# Patient Record
Sex: Male | Born: 1995 | Race: White | Hispanic: No | Marital: Single | Smoking: Current every day smoker
Health system: Southern US, Community
[De-identification: ages and names within clinical notes are randomized; demographics above are authoritative.]

## PROBLEM LIST (undated history)

## (undated) HISTORY — PX: HERNIA REPAIR: SHX51

---
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
## 2016-02-12 ENCOUNTER — Ambulatory Visit (INDEPENDENT_AMBULATORY_CARE_PROVIDER_SITE_OTHER): Payer: BLUE CROSS/BLUE SHIELD | Admitting: Family Medicine

## 2016-02-12 ENCOUNTER — Encounter: Payer: Self-pay | Admitting: Family Medicine

## 2016-02-12 VITALS — BP 116/74 | Temp 98.6°F | Ht 76.0 in | Wt 241.0 lb

## 2016-02-12 DIAGNOSIS — B360 Pityriasis versicolor: Secondary | ICD-10-CM | POA: Diagnosis not present

## 2016-02-12 MED ORDER — KETOCONAZOLE 2 % EX CREA: 1.0000 "application " | TOPICAL_CREAM | Freq: Two times a day (BID) | CUTANEOUS | Status: DC

## 2016-02-12 MED ORDER — FLUCONAZOLE 150 MG PO TABS
ORAL_TABLET | ORAL | Status: DC
Start: 2016-02-12 — End: 2016-09-02

## 2016-02-12 NOTE — Progress Notes (Signed)
   Subjective:    Patient ID: Richard Shah, male    DOB: July 24, 1996, 20 y.o.   MRN: [id number]  Rash This is a new problem. Episode onset: one month ago. Location: neck and back. The rash is characterized by itchiness. He was exposed to nothing. Past treatments include nothing.    No drainage some itching with it no fever chills  Review of Systems  Skin: Positive for rash.       Objective:   Physical Exam  What appears to be tinea versicolor on the upper back and around the neck area      Assessment & Plan:  Richard Shah a versicolor will treat this with Diflucan by mouth 300 mg once a week for 2 weeks plus also topical treatment twice daily

## 2016-05-07 ENCOUNTER — Ambulatory Visit: Payer: BLUE CROSS/BLUE SHIELD

## 2016-09-02 ENCOUNTER — Ambulatory Visit (INDEPENDENT_AMBULATORY_CARE_PROVIDER_SITE_OTHER): Payer: BLUE CROSS/BLUE SHIELD | Admitting: Family Medicine

## 2016-09-02 ENCOUNTER — Encounter: Payer: Self-pay | Admitting: Family Medicine

## 2016-09-02 VITALS — BP 120/80 | Ht 76.0 in | Wt 239.1 lb

## 2016-09-02 DIAGNOSIS — F439 Reaction to severe stress, unspecified: Secondary | ICD-10-CM

## 2016-09-02 DIAGNOSIS — N529 Male erectile dysfunction, unspecified: Secondary | ICD-10-CM | POA: Diagnosis not present

## 2016-09-02 NOTE — Progress Notes (Signed)
   Subjective:    Patient ID: Richard Shah, male    DOB: 1996-10-18, 20 y.o.   MRN: [id number]  HPI Patient is here today to discuss issues he is having with sexual encounters. Onset 2 weeks ago. Treatments tried: none.  Patient relates that he is able to get an erection in the morning when he wakes up. He is able to get an erection with masturbation. He has had 4 different episodes with girlfriend where he had a hard time getting an erection and is caused him some psychologic stresses well. Patient has no other concerns at this time.  Patient under a lot of stress denies being depressed. States he finds himself being anxious at times. At times feels low bit down about how things are. Patient denies being suicidal homicidal.  Review of Systems     Objective:   Physical Exam Lungs clear heart regular       Assessment & Plan:  Fatigue-encourage patient get decent amount arrest Erectile dysfunction I believe this is stress oriented. Check testosterone level and thyroid level We talked about some sexual health ideas that would help with erectile function. I do not believe the patient is depressed but I do believe he is significantly stressed and would benefit from seeing a counselor. I recommend referral to Dr. Claudine Moutonodenberg for further counseling. I believe that the patient can do a better job with handling his stress the sexual health function problem will resolve itself.

## 2016-09-03 LAB — TESTOSTERONE: Testosterone: 488 ng/dL (ref 264–916)

## 2016-09-03 LAB — TSH: TSH: 0.871 u[IU]/mL (ref 0.450–4.500)

## 2016-09-04 ENCOUNTER — Encounter: Payer: Self-pay | Admitting: Family Medicine

## 2016-09-19 ENCOUNTER — Encounter: Payer: Self-pay | Admitting: Family Medicine

## 2017-01-09 ENCOUNTER — Telehealth (HOSPITAL_COMMUNITY): Payer: Self-pay | Admitting: *Deleted

## 2017-01-09 NOTE — Telephone Encounter (Signed)
patient returned phone call.   His mother had called earlier asking questions regarding voice message left on phone regarding an appointment.  It was explained to patient that due to confidentiality  we could not provide answers to questions his mom asked regarding phone call.  She said we left message on her phone and she needed to know what the call was about.   Explained to patient that the phone number is in the system, provided by the referring doctor office, and that was the number we used to call him.  Patient said he do not wish to schedule an appointment.

## 2017-01-09 NOTE — Telephone Encounter (Signed)
left voice message regarding appointment. 

## 2017-06-13 ENCOUNTER — Encounter (HOSPITAL_BASED_OUTPATIENT_CLINIC_OR_DEPARTMENT_OTHER): Payer: Self-pay | Admitting: *Deleted

## 2017-06-13 ENCOUNTER — Emergency Department (HOSPITAL_BASED_OUTPATIENT_CLINIC_OR_DEPARTMENT_OTHER)
Admission: EM | Admit: 2017-06-13 | Discharge: 2017-06-13 | Disposition: A | Discharge status: Home / Self Care | Payer: Worker's Compensation | Attending: Emergency Medicine | Admitting: Emergency Medicine

## 2017-06-13 DIAGNOSIS — S39012A Strain of muscle, fascia and tendon of lower back, initial encounter: Secondary | ICD-10-CM | POA: Diagnosis not present

## 2017-06-13 DIAGNOSIS — F17200 Nicotine dependence, unspecified, uncomplicated: Secondary | ICD-10-CM | POA: Insufficient documentation

## 2017-06-13 DIAGNOSIS — Y939 Activity, unspecified: Secondary | ICD-10-CM | POA: Insufficient documentation

## 2017-06-13 DIAGNOSIS — Y929 Unspecified place or not applicable: Secondary | ICD-10-CM | POA: Insufficient documentation

## 2017-06-13 DIAGNOSIS — X500XXA Overexertion from strenuous movement or load, initial encounter: Secondary | ICD-10-CM | POA: Insufficient documentation

## 2017-06-13 DIAGNOSIS — S3992XA Unspecified injury of lower back, initial encounter: Secondary | ICD-10-CM | POA: Diagnosis present

## 2017-06-13 DIAGNOSIS — Y990 Civilian activity done for income or pay: Secondary | ICD-10-CM | POA: Diagnosis not present

## 2017-06-13 MED ORDER — IBUPROFEN 600 MG PO TABS: 600.0000 mg | ORAL_TABLET | Freq: Three times a day (TID) | ORAL | 0 refills | Status: DC | PRN

## 2017-06-13 NOTE — ED Triage Notes (Addendum)
Back injury at work 3 days ago. He has already had a UDS. C.o pain.

## 2017-06-14 NOTE — ED Provider Notes (Signed)
[facility]   CSN: [id number] Arrival date & time: 06/13/17  2232     History   Chief Complaint Chief Complaint  Patient presents with  . Back Pain    HPI Richard Shah is a 21 y.o. male.  HPI Patient presents the emergency department after injuring his back 3 days ago work.  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  He denies weakness in his arms and legs.  Denies prior history of back issues.  No fevers or chills.  Denies abdominal pain.  No chest pain shortness of breath.  No other complaints.  Symptoms are mild in severity.  He presents requesting paperwork for work to be filled out   History reviewed. No pertinent past medical history.  There are no active problems to display for this patient.   Past Surgical History:  Procedure Laterality Date  . HERNIA REPAIR         Home Medications    Prior to Admission medications   Medication Sig Start Date End Date Taking? Authorizing Provider  ibuprofen (ADVIL,MOTRIN) 600 MG tablet Take 1 tablet (600 mg total) by mouth every 8 (eight) hours as needed. 06/13/17   Azalia Bilis, MD    Family History No family history on file.  Social History Social History  Substance Use Topics  . Smoking status: Current Every Day Smoker  . Smokeless tobacco: Never Used  . Alcohol use No     Allergies   Patient has no known allergies.   Review of Systems Review of Systems  All other systems reviewed and are negative.    Physical Exam Updated Vital Signs BP (!) 145/94   Pulse 68   Temp 98.2 F (36.8 C) (Oral)   Resp 18   Ht 6\' 4"  (1.93 m)   Wt 108.9 kg (240 lb)   SpO2 100%   BMI 29.21 kg/m   Physical Exam  Constitutional: He is oriented to person, place, and time. He appears well-developed and well-nourished.  HENT:  Head: Normocephalic.  Eyes: EOM are normal.  Neck: Normal range of  motion.  Pulmonary/Chest: Effort normal.  Abdominal: He exhibits no distension.  Musculoskeletal: Normal range of motion.  No thoracic or lumbar point tenderness.  Mild paralumbar tenderness on the right without significant spasm.  Normal ambulation.  Normal strength in his lower extremities  Neurological: He is alert and oriented to person, place, and time.  Psychiatric: He has a normal mood and affect.  Nursing note and vitals reviewed.    ED Treatments / Results  Labs (all labs ordered are listed, but only abnormal results are displayed) Labs Reviewed - No data to display  EKG  EKG Interpretation None       Radiology No results found.  Procedures Procedures (including critical care time)  Medications Ordered in ED Medications - No data to display   Initial Impression / Assessment and Plan / ED Course  I have reviewed the triage vital signs and the nursing notes.  Pertinent labs & imaging results that were available during my care of the patient were reviewed by me and considered in my medical decision making (see chart for details).     NSAIDs recommended.  Online physical therapy modules for low back pain recommended  Final Clinical Impressions(s) / ED Diagnoses   Final diagnoses:  Lumbar strain, initial encounter    New Prescriptions Discharge  Medication List as of 06/13/2017 11:39 PM    START taking these medications   Details  ibuprofen (ADVIL,MOTRIN) 600 MG tablet Take 1 tablet (600 mg total) by mouth every 8 (eight) hours as needed., Starting Fri 06/13/2017, Print         Azalia Bilis, MD 06/14/17 0130

## 2017-09-17 ENCOUNTER — Ambulatory Visit (INDEPENDENT_AMBULATORY_CARE_PROVIDER_SITE_OTHER): Payer: BLUE CROSS/BLUE SHIELD | Admitting: Family Medicine

## 2017-09-17 ENCOUNTER — Encounter: Payer: Self-pay | Admitting: Family Medicine

## 2017-09-17 VITALS — Temp 97.8°F | Ht 75.0 in | Wt 235.0 lb

## 2017-09-17 DIAGNOSIS — J209 Acute bronchitis, unspecified: Secondary | ICD-10-CM

## 2017-09-17 DIAGNOSIS — J019 Acute sinusitis, unspecified: Secondary | ICD-10-CM

## 2017-09-17 MED ORDER — AMOXICILLIN-POT CLAVULANATE 875-125 MG PO TABS: 1.0000 | ORAL_TABLET | Freq: Two times a day (BID) | ORAL | 0 refills | Status: DC

## 2017-09-17 NOTE — Progress Notes (Signed)
   Subjective:    Patient ID: Richard Shah, male    DOB: Dec 18, 1995, 21 y.o.   MRN: [id number]  Cough  This is a new problem. The current episode started in the past 7 days. Associated symptoms include headaches, nasal congestion, rhinorrhea and a sore throat. Pertinent negatives include no chest pain, ear pain, fever or wheezing. Treatments tried: cold med from urgent care.   Patient went to urgent care was diagnosed with bronchitis put on medications he states he is having some fever and chills some head congestion and drainage denies any trouble wheezing denies any shortness of breath still smokes he knows he needs to quit   Review of Systems  Constitutional: Negative for activity change and fever.  HENT: Positive for congestion, rhinorrhea and sore throat. Negative for ear pain.   Eyes: Negative for discharge.  Respiratory: Positive for cough. Negative for wheezing.   Cardiovascular: Negative for chest pain.  Neurological: Positive for headaches.       Objective:   Physical Exam  Constitutional: He appears well-developed.  HENT:  Head: Normocephalic.  Mouth/Throat: Oropharynx is clear and moist. No oropharyngeal exudate.  Neck: Normal range of motion.  Cardiovascular: Normal rate, regular rhythm and normal heart sounds.  No murmur heard. Pulmonary/Chest: Effort normal and breath sounds normal. He has no wheezes.  Lymphadenopathy:    He has no cervical adenopathy.  Neurological: He exhibits normal muscle tone.  Skin: Skin is warm and dry.  Nursing note and vitals reviewed.   Work excuse given for today and tomorrow patient will rest up until Monday he will follow-up sooner if any progressive troubles or problems      Assessment & Plan:  Viral illness Secondary rhinosinusitis Mild bronchitis No sign of pneumonia no x-rays or lab work indicated Patient defers on Rocephin shot Augmentin 875 twice daily for 10 days warning signs discussed follow-up if progressive  troubles or worse

## 2017-11-10 ENCOUNTER — Encounter: Payer: Self-pay | Admitting: Family Medicine

## 2017-11-10 ENCOUNTER — Ambulatory Visit (HOSPITAL_COMMUNITY)
Admission: RE | Admit: 2017-11-10 | Discharge: 2017-11-10 | Disposition: A | Discharge status: Home / Self Care | Payer: BLUE CROSS/BLUE SHIELD | Source: Ambulatory Visit | Attending: Family Medicine | Admitting: Family Medicine

## 2017-11-10 ENCOUNTER — Ambulatory Visit (INDEPENDENT_AMBULATORY_CARE_PROVIDER_SITE_OTHER): Payer: BLUE CROSS/BLUE SHIELD | Admitting: Family Medicine

## 2017-11-10 VITALS — BP 130/84 | Temp 98.7°F | Ht 75.0 in | Wt 236.0 lb

## 2017-11-10 DIAGNOSIS — M5431 Sciatica, right side: Secondary | ICD-10-CM | POA: Insufficient documentation

## 2017-11-10 MED ORDER — CHLORZOXAZONE 500 MG PO TABS: 500.0000 mg | ORAL_TABLET | Freq: Four times a day (QID) | ORAL | 0 refills | Status: DC | PRN

## 2017-11-10 MED ORDER — DICLOFENAC SODIUM 75 MG PO TBEC: 75.0000 mg | DELAYED_RELEASE_TABLET | Freq: Two times a day (BID) | ORAL | 0 refills | Status: DC

## 2017-11-10 NOTE — Progress Notes (Signed)
   Subjective:    Patient ID: Richard Shah, male    DOB: 11-Sep-1996, 22 y.o.   MRN: [id number]  HPI  Patient is here today with complaints of mid to lower back pain that is muscular in nature per the pt. He states he has pain when he moves that starts in mid to lower back with it radiating down the right leg.Symptoms started on Saturday, he has not taken any thing for this. Significant radiation down the right leg denies weakness.  States this is been repetitive to him.  States it caused a lot of pain and discomfort he would like to have it checked out further He denies weakness in the leg.  No loss of bowel or bladder control. Review of Systems  Constitutional: Negative for activity change.  HENT: Negative for congestion and rhinorrhea.   Respiratory: Negative for cough and shortness of breath.   Cardiovascular: Negative for chest pain.  Gastrointestinal: Negative for abdominal pain, diarrhea, nausea and vomiting.  Genitourinary: Negative for dysuria and hematuria.  Musculoskeletal: Positive for back pain.  Neurological: Negative for weakness and headaches.  Psychiatric/Behavioral: Negative for confusion.       Objective:   Physical Exam  Constitutional: He appears well-nourished.  Cardiovascular: Normal rate, regular rhythm and normal heart sounds.  No murmur heard. Pulmonary/Chest: Effort normal and breath sounds normal.  Musculoskeletal: He exhibits no edema.  Subjective discomfort right lower back positive straight leg raise no weakness detected on physical exam  Lymphadenopathy:    He has no cervical adenopathy.  Neurological: He is alert.  Psychiatric: His behavior is normal.  Vitals reviewed.         Assessment & Plan:  Low back pain with sciatica Anti-inflammatory Stretching exercises recommended Muscle relaxers only at home Plain x-rays ordered because of repetitive nature of this Patient would like consultation with a back specialist regarding the  repetitive nature of this

## 2017-11-12 ENCOUNTER — Encounter: Payer: Self-pay | Admitting: Family Medicine

## 2017-11-13 ENCOUNTER — Encounter: Payer: Self-pay | Admitting: Family Medicine

## 2017-11-14 ENCOUNTER — Encounter: Payer: Self-pay | Admitting: Family Medicine

## 2018-08-23 IMAGING — DX DG LUMBAR SPINE COMPLETE 4+V
5 series · 5 of 5 positions shown · non-contrast
Comparison: None.

CLINICAL DATA: Low back pain and right-sided sciatica. Back injury
lifting heavy object several months ago. Initial encounter.

EXAM:
LUMBAR SPINE - COMPLETE 4+ VIEW

[l-spine ap]
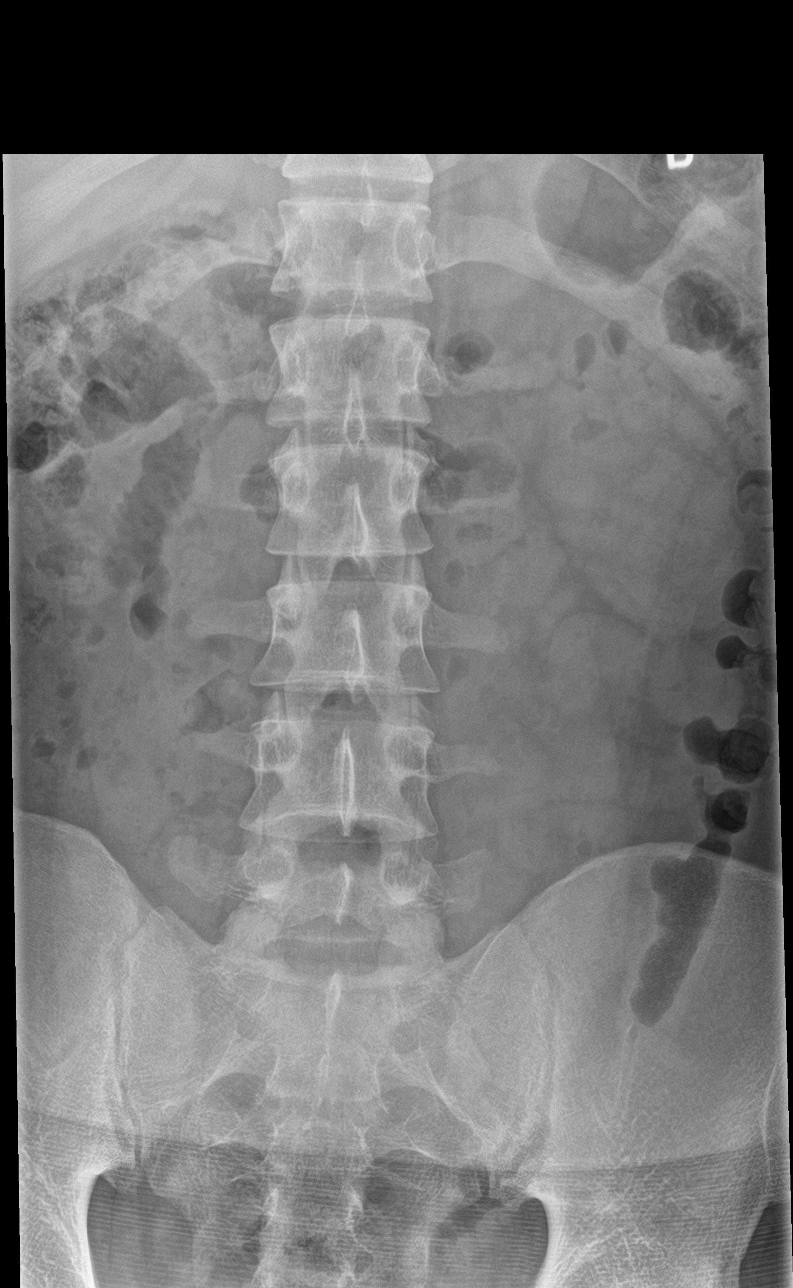

[l-spine obl (1 of 2)]
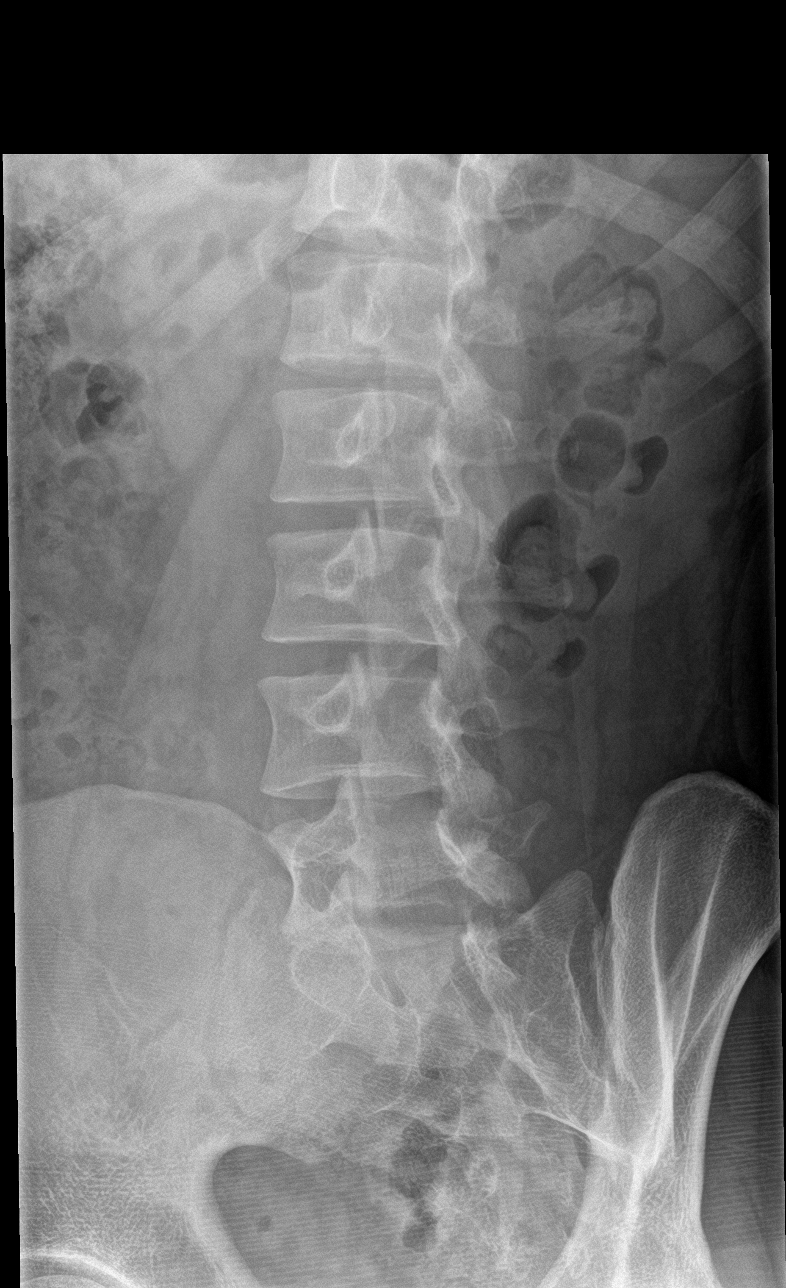

[l-spine obl (2 of 2)]
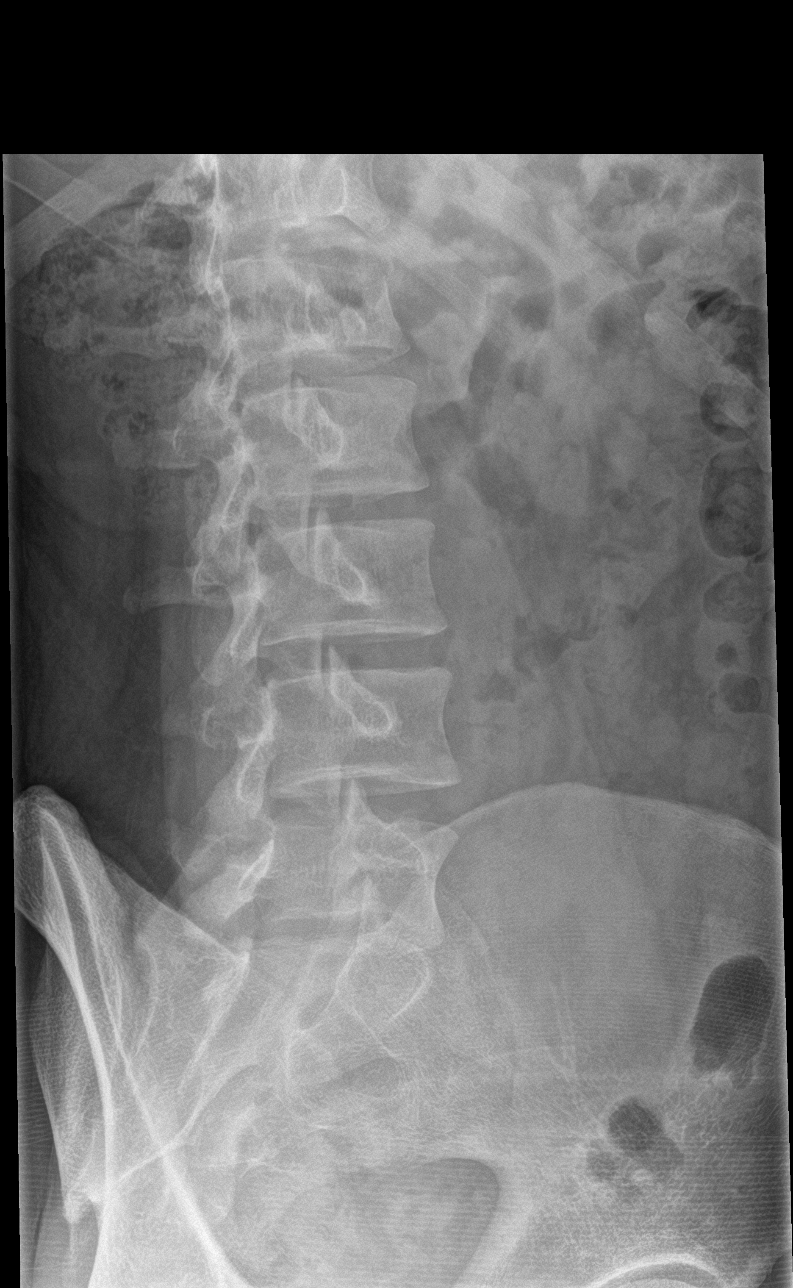

[l-spine lat]
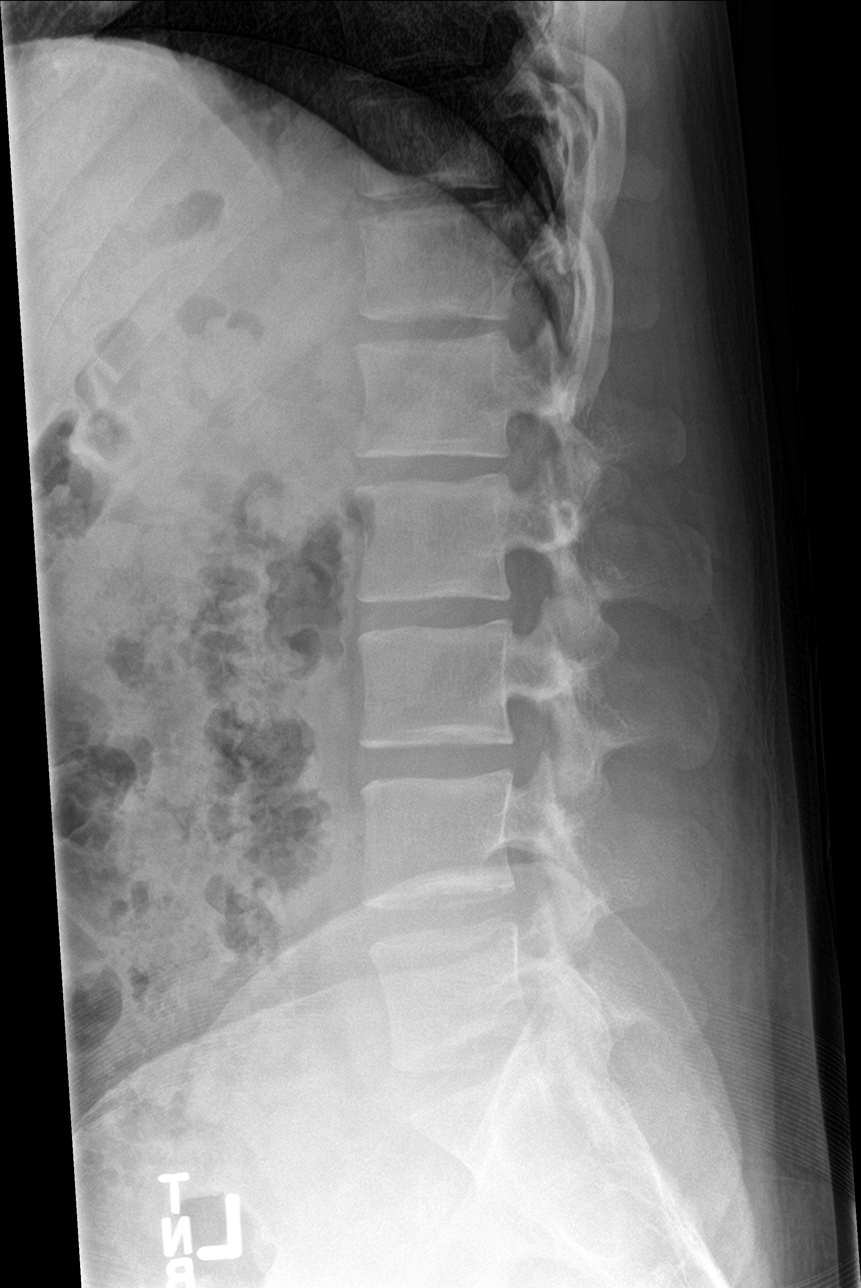

[l-spine spot]
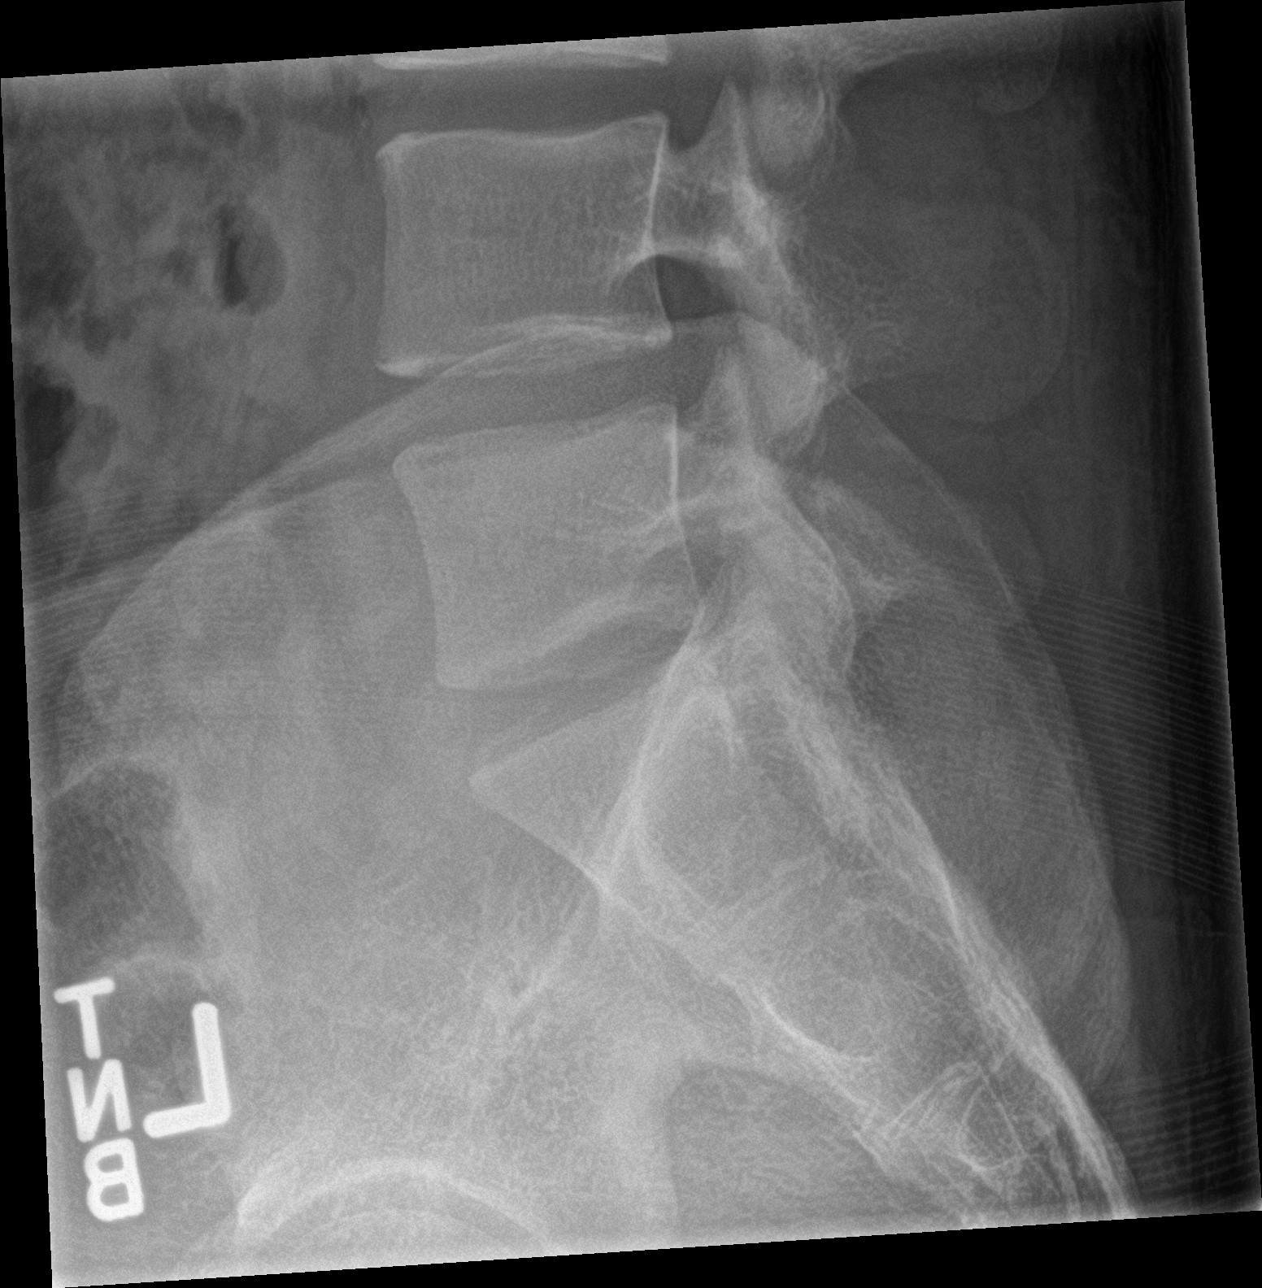

[5 of 5 positions shown; findings below may reference images not displayed]

FINDINGS: There is no evidence of lumbar spine fracture. Alignment is normal.
Intervertebral disc spaces are maintained. No evidence of facet
arthropathy or other osseous abnormality.
IMPRESSION: Negative.

## 2019-09-20 ENCOUNTER — Other Ambulatory Visit: Payer: Self-pay

## 2019-09-20 ENCOUNTER — Encounter: Payer: Self-pay | Admitting: Family Medicine

## 2019-09-20 ENCOUNTER — Ambulatory Visit (INDEPENDENT_AMBULATORY_CARE_PROVIDER_SITE_OTHER): Payer: BC Managed Care – PPO | Admitting: Family Medicine

## 2019-09-20 DIAGNOSIS — Z20822 Contact with and (suspected) exposure to covid-19: Secondary | ICD-10-CM

## 2019-09-20 DIAGNOSIS — B9689 Other specified bacterial agents as the cause of diseases classified elsewhere: Secondary | ICD-10-CM

## 2019-09-20 DIAGNOSIS — Z20828 Contact with and (suspected) exposure to other viral communicable diseases: Secondary | ICD-10-CM

## 2019-09-20 DIAGNOSIS — J019 Acute sinusitis, unspecified: Secondary | ICD-10-CM | POA: Diagnosis not present

## 2019-09-20 MED ORDER — CEFPROZIL 500 MG PO TABS: 500.0000 mg | ORAL_TABLET | Freq: Two times a day (BID) | ORAL | 0 refills | Status: DC

## 2019-09-20 MED ORDER — ALBUTEROL SULFATE HFA 108 (90 BASE) MCG/ACT IN AERS: 2.0000 | INHALATION_SPRAY | Freq: Four times a day (QID) | RESPIRATORY_TRACT | 2 refills | Status: DC | PRN

## 2019-09-20 NOTE — Progress Notes (Signed)
   Subjective:    Patient ID: Richard Shah, male    DOB: 09-19-1996, 23 y.o.   MRN: [id number]  Sinusitis This is a new problem. Episode onset: last Wednesday. Associated symptoms include congestion, coughing and headaches. Pertinent negatives include no chills or ear pain. (Runny nose) Treatments tried: OTC meds. The treatment provided mild relief.  Significant fatigue tiredness head congestion sinus pressure not feeling good some moderate coughing and congestion some headaches no wheezing heard by the patient but he does state he coughs when he takes a deep breath PMH benign Virtual Visit via Telephone Note  I connected with Richard Shah on 09/20/19 at  3:50 PM EST by telephone and verified that I am speaking with the correct person using two identifiers.  Location: Patient: home Provider: office   I discussed the limitations, risks, security and privacy concerns of performing an evaluation and management service by telephone and the availability of in person appointments. I also discussed with the patient that there may be a patient responsible charge related to this service. The patient expressed understanding and agreed to proceed.   History of Present Illness:    Observations/Objective:   Assessment and Plan:   Follow Up Instructions:    I discussed the assessment and treatment plan with the patient. The patient was provided an opportunity to ask questions and all were answered. The patient agreed with the plan and demonstrated an understanding of the instructions.   The patient was advised to call back or seek an in-person evaluation if the symptoms worsen or if the condition fails to improve as anticipated.  I provided 17 minutes of non-face-to-face time during this encounter.   Vicente Males, LPN    Review of Systems  Constitutional: Negative for activity change, chills and fever.  HENT: Positive for congestion and rhinorrhea. Negative for ear pain.    Eyes: Negative for discharge.  Respiratory: Positive for cough. Negative for wheezing.   Cardiovascular: Negative for chest pain.  Gastrointestinal: Negative for nausea and vomiting.  Musculoskeletal: Negative for arthralgias.  Neurological: Positive for headaches.       Objective:   Physical Exam Today's visit was via telephone Physical exam was not possible for this visit        Assessment & Plan:  Reactive airway acute rhinosinusitis I recommend staying out of work this week antibiotics albuterol plus Covid testing warning signs discussed follow-up to ER if significant shortness of breath

## 2019-09-21 ENCOUNTER — Other Ambulatory Visit: Payer: Self-pay

## 2019-09-21 DIAGNOSIS — Z20822 Contact with and (suspected) exposure to covid-19: Secondary | ICD-10-CM

## 2019-09-23 LAB — NOVEL CORONAVIRUS, NAA: SARS-CoV-2, NAA: NOT DETECTED

## 2020-01-05 ENCOUNTER — Ambulatory Visit: Payer: BC Managed Care – PPO | Admitting: Family Medicine

## 2020-01-05 ENCOUNTER — Other Ambulatory Visit: Payer: Self-pay

## 2020-01-05 DIAGNOSIS — T148XXA Other injury of unspecified body region, initial encounter: Secondary | ICD-10-CM

## 2020-01-05 DIAGNOSIS — M79641 Pain in right hand: Secondary | ICD-10-CM

## 2020-01-05 NOTE — Progress Notes (Signed)
   Subjective:    Patient ID: Blanch Media, male    DOB: Aug 23, 1996, 24 y.o.   MRN: [id number]  Hand Pain  The incident occurred 3 to 5 days ago. The pain is present in the right hand (pinky finer). Associated symptoms include numbness.   Patient states he smashed his finger Saturday and is now not feeling any pain just numbness.   Review of Systems  Neurological: Positive for numbness.       Objective:   Physical Exam  Alert vitals stable, NAD. Blood pressure good on repeat. HEENT normal. Lungs clear. Heart regular rate and rhythm. Patient presents with impressive subungual hematoma.  Patient was cleansed with alcohol.  With 18 oh blade we drilled a hole and delivered blood from beneath the nail.      Assessment & Plan:  Impression #1 subungual hematoma management discussed.  Warning signs discussed.  Expectations regarding nail discussed.  Hold off on x-ray at this time rationale discussed

## 2020-02-15 ENCOUNTER — Telehealth: Payer: Self-pay | Admitting: Family Medicine

## 2020-02-15 ENCOUNTER — Other Ambulatory Visit: Payer: Self-pay

## 2020-02-15 ENCOUNTER — Telehealth (INDEPENDENT_AMBULATORY_CARE_PROVIDER_SITE_OTHER): Payer: BC Managed Care – PPO | Admitting: Family Medicine

## 2020-02-15 ENCOUNTER — Encounter: Payer: Self-pay | Admitting: Family Medicine

## 2020-02-15 DIAGNOSIS — J019 Acute sinusitis, unspecified: Secondary | ICD-10-CM | POA: Diagnosis not present

## 2020-02-15 MED ORDER — AMOXICILLIN 500 MG PO TABS: 500.0000 mg | ORAL_TABLET | Freq: Three times a day (TID) | ORAL | 0 refills | Status: DC

## 2020-02-15 NOTE — Progress Notes (Signed)
q 

## 2020-02-15 NOTE — Telephone Encounter (Signed)
Patient gives verbal consent for phone visit.

## 2020-02-15 NOTE — Progress Notes (Signed)
   Subjective:    Patient ID: Richard Shah, male    DOB: 1996/09/16, 24 y.o.   MRN: [id number]  Cough This is a new problem. The current episode started yesterday. The cough is productive of sputum. Associated symptoms comments: Congestion; legs began to ache yesterday. Treatments tried: Sudafed. The treatment provided no relief.   Virtual Visit via Telephone Note  I connected with Richard Shah on 02/15/20 at  3:50 PM EDT by telephone and verified that I am speaking with the correct person using two identifiers.  Location: Patient: home Provider: office   I discussed the limitations, risks, security and privacy concerns of performing an evaluation and management service by telephone and the availability of in person appointments. I also discussed with the patient that there may be a patient responsible charge related to this service. The patient expressed understanding and agreed to proceed.   History of Present Illness:    Observations/Objective:   Assessment and Plan:   Follow Up Instructions:    I discussed the assessment and treatment plan with the patient. The patient was provided an opportunity to ask questions and all were answered. The patient agreed with the plan and demonstrated an understanding of the instructions.   The patient was advised to call back or seek an in-person evaluation if the symptoms worsen or if the condition fails to improve as anticipated.  I provided 15 minutes of non-face-to-face time during this encounter.        Review of Systems  Respiratory: Positive for cough.    Runny nose sneezing itchy eyes head congestion    Objective:   Physical Exam        Assessment & Plan:  Probable sinus infection has had allergies over the past week and a half Go ahead with antibiotics to cover for the possibility of infection I doubt Covid but I did tell the patient we cannot 100% rule that out without testing Patient does not feel  that he has been around anyone to get sick If he starts developing body aches fevers chills sweats difficulty breathing he will immediately get checked and follow-up here or ER

## 2020-02-16 ENCOUNTER — Encounter: Payer: Self-pay | Admitting: Family Medicine

## 2020-02-21 ENCOUNTER — Ambulatory Visit: Payer: Self-pay | Attending: Internal Medicine

## 2020-02-21 ENCOUNTER — Other Ambulatory Visit: Payer: Self-pay

## 2020-02-21 ENCOUNTER — Telehealth (INDEPENDENT_AMBULATORY_CARE_PROVIDER_SITE_OTHER): Payer: BC Managed Care – PPO | Admitting: Family Medicine

## 2020-02-21 ENCOUNTER — Telehealth: Payer: Self-pay | Admitting: *Deleted

## 2020-02-21 DIAGNOSIS — Z20822 Contact with and (suspected) exposure to covid-19: Secondary | ICD-10-CM | POA: Insufficient documentation

## 2020-02-21 DIAGNOSIS — J019 Acute sinusitis, unspecified: Secondary | ICD-10-CM | POA: Diagnosis not present

## 2020-02-21 DIAGNOSIS — J4521 Mild intermittent asthma with (acute) exacerbation: Secondary | ICD-10-CM | POA: Diagnosis not present

## 2020-02-21 MED ORDER — ALBUTEROL SULFATE HFA 108 (90 BASE) MCG/ACT IN AERS: 2.0000 | INHALATION_SPRAY | RESPIRATORY_TRACT | 2 refills | Status: DC | PRN

## 2020-02-21 MED ORDER — AZITHROMYCIN 250 MG PO TABS: ORAL_TABLET | ORAL | 0 refills | Status: DC

## 2020-02-21 MED ORDER — PREDNISONE 20 MG PO TABS: ORAL_TABLET | ORAL | 0 refills | Status: DC

## 2020-02-21 NOTE — Telephone Encounter (Signed)
Mr. govanni, plemons are scheduled for a virtual visit with your provider today.    Just as we do with appointments in the office, we must obtain your consent to participate.  Your consent will be active for this visit and any virtual visit you may have with one of our providers in the next 365 days.    If you have a MyChart account, I can also send a copy of this consent to you electronically.  All virtual visits are billed to your insurance company just like a traditional visit in the office.  As this is a virtual visit, video technology does not allow for your provider to perform a traditional examination.  This may limit your provider's ability to fully assess your condition.  If your provider identifies any concerns that need to be evaluated in person or the need to arrange testing such as labs, EKG, etc, we will make arrangements to do so.    Although advances in technology are sophisticated, we cannot ensure that it will always work on either your end or our end.  If the connection with a video visit is poor, we may have to switch to a telephone visit.  With either a video or telephone visit, we are not always able to ensure that we have a secure connection.   I need to obtain your verbal consent now.   Are you willing to proceed with your visit today?   LANDEN KNOEDLER has provided verbal consent on 02/21/2020 for a virtual visit (video or telephone).   Kyra Manges, LPN 04/27/5884  02:77 AM

## 2020-02-21 NOTE — Progress Notes (Addendum)
   Subjective:    Patient ID: Richard Shah, male    DOB: 21-Sep-1996, 24 y.o.   MRN: [id number] Initially was a virtual visit but then patient was brought to the outside facility in order to examine him Cough This is a new problem. Episode onset: one week. Associated symptoms comments: Congestion, sweating started today. Treatments tried: amoxil.  Head congestion drainage coughing sneezing in addition to this chest congestion and bronchial cough.  Denies feeling short of breath.  Did have some sweats today.  Energy level low. Virtual Visit via Telephone Note  I connected with Richard Shah on 02/21/20 at  4:10 PM EDT by telephone and verified that I am speaking with the correct person using two identifiers.  Location: Patient: home Provider: office   I discussed the limitations, risks, security and privacy concerns of performing an evaluation and management service by telephone and the availability of in person appointments. I also discussed with the patient that there may be a patient responsible charge related to this service. The patient expressed understanding and agreed to proceed.   History of Present Illness:    Observations/Objective:   Assessment and Plan:   Follow Up Instructions:    I discussed the assessment and treatment plan with the patient. The patient was provided an opportunity to ask questions and all were answered. The patient agreed with the plan and demonstrated an understanding of the instructions.   The patient was advised to call back or seek an in-person evaluation if the symptoms worsen or if the condition fails to improve as anticipated.  I provided 20 minutes of non-face-to-face time during this encounter.        Review of Systems  Respiratory: Positive for cough.   Runny nose cough sweats denies high fever denies shortness of breath     Objective:   Physical Exam O2 saturation 95% No respiratory distress Bilateral expiratory  wheezes mild Moves air well Heart regular No crackles or rails       Assessment & Plan:  Acute rhinosinusitis  Probable underlying viral illness as well  reactive airway with bronchial changes Short course prednisone, albuterol 2 puffs every 4 hours, switch to Zithromax, if progressive symptoms or if worse to follow-up Warning signs discussed Covid testing pending Work excuse given for Monday Tuesday Wednesday

## 2020-02-22 LAB — NOVEL CORONAVIRUS, NAA: SARS-CoV-2, NAA: NOT DETECTED

## 2020-02-22 LAB — SARS-COV-2, NAA 2 DAY TAT

## 2024-11-01 ENCOUNTER — Ambulatory Visit (INDEPENDENT_AMBULATORY_CARE_PROVIDER_SITE_OTHER)

## 2024-11-01 ENCOUNTER — Encounter: Payer: Self-pay | Admitting: Emergency Medicine

## 2024-11-01 ENCOUNTER — Ambulatory Visit: Payer: Self-pay | Admitting: Nurse Practitioner

## 2024-11-01 ENCOUNTER — Ambulatory Visit
Admission: EM | Admit: 2024-11-01 | Discharge: 2024-11-01 | Disposition: A | Discharge status: Home / Self Care | Attending: Family Medicine | Admitting: Family Medicine

## 2024-11-01 DIAGNOSIS — R051 Acute cough: Secondary | ICD-10-CM

## 2024-11-01 DIAGNOSIS — J069 Acute upper respiratory infection, unspecified: Secondary | ICD-10-CM

## 2024-11-01 DIAGNOSIS — R062 Wheezing: Secondary | ICD-10-CM

## 2024-11-01 LAB — POCT INFLUENZA A/B
Influenza A, POC: NEGATIVE
Influenza B, POC: NEGATIVE

## 2024-11-01 LAB — POC SOFIA SARS ANTIGEN FIA: SARS Coronavirus 2 Ag: NEGATIVE

## 2024-11-01 MED ORDER — ALBUTEROL SULFATE HFA 108 (90 BASE) MCG/ACT IN AERS: 1.0000 | INHALATION_SPRAY | Freq: Four times a day (QID) | RESPIRATORY_TRACT | 0 refills | Status: AC | PRN

## 2024-11-01 MED ORDER — BENZONATATE 200 MG PO CAPS: 200.0000 mg | ORAL_CAPSULE | Freq: Three times a day (TID) | ORAL | 0 refills | Status: AC | PRN

## 2024-11-01 MED ORDER — IPRATROPIUM-ALBUTEROL 0.5-2.5 (3) MG/3ML IN SOLN
3.0000 mL | Freq: Once | RESPIRATORY_TRACT | Status: AC
  Administered 2024-11-01: 3 mL via RESPIRATORY_TRACT

## 2024-11-01 MED ORDER — PREDNISONE 20 MG PO TABS: 40.0000 mg | ORAL_TABLET | Freq: Every day | ORAL | 0 refills | Status: AC

## 2024-11-01 NOTE — Discharge Instructions (Signed)
 You tested negative for COVID and flu.  You may take Tessalon  3 times a day as needed for your cough.  Use the albuterol  inhaler as needed for wheezing or shortness of breath.  Prednisone  daily for 5 days.  Lots of rest and fluids and follow-up with your PCP in 2 to 3 days for recheck.  Please go to the ER for any worsening symptoms.  Hope you feel better soon!

## 2024-11-01 NOTE — ED Triage Notes (Signed)
 Patient reports cough,  headache, fatigued and chest congestion x 3 days. Patient has taken Dayquil/Nyquil and Ibuprofen  with no relief.  Rates pain 3/10.

## 2024-11-01 NOTE — ED Provider Notes (Signed)
 " Richard Shah    CSN: [id number] Arrival date & time: 11/01/24  0843      History   Chief Complaint Chief Complaint  Patient presents with   Headache   Fatigue   Cough    HPI Richard Shah is a 29 y.o. male  presents for evaluation of URI symptoms for 3 days. Patient reports associated symptoms of cough, congestion, headache, fatigue, wheezing. Denies N/V/D, fevers, sore throat, body aches, shortness of breath. Patient does not have a hx of asthma but states he said use an inhaler when sick in the past. Patient is an active smoker.   Reports sick contacts via work.  Pt has taken DayQuil, NyQuil and ibuprofen  OTC for symptoms. Pt has no other concerns at this time.    Headache Associated symptoms: congestion, cough and fatigue   Cough Associated symptoms: headaches and wheezing     History reviewed. No pertinent past medical history.  There are no active problems to display for this patient.   Past Surgical History:  Procedure Laterality Date   HERNIA REPAIR         Home Medications    Prior to Admission medications  Medication Sig Start Date End Date Taking? Authorizing Provider  albuterol  (VENTOLIN  HFA) 108 (90 Base) MCG/ACT inhaler Inhale 1-2 puffs into the lungs every 6 (six) hours as needed for wheezing or shortness of breath. 11/01/24  Yes [place] Schreiner, Jodi R, NP  benzonatate  (TESSALON ) 200 MG capsule Take 1 capsule (200 mg total) by mouth 3 (three) times daily as needed for cough. 11/01/24  Yes Tremond Shimabukuro, Jodi R, NP  predniSONE  (DELTASONE ) 20 MG tablet Take 2 tablets (40 mg total) by mouth daily with breakfast for 5 days. 11/01/24 11/06/24 Yes Loreda Myla SAUNDERS, NP    Family History History reviewed. No pertinent family history.  Social History Social History[1]   Allergies   Patient has no known allergies.   Review of Systems Review of Systems  Constitutional:  Positive for fatigue.  HENT:  Positive for congestion.   Respiratory:  Positive for cough  and wheezing.   Neurological:  Positive for headaches.     Physical Exam Triage Vital Signs ED Triage Vitals  Encounter Vitals Group     BP 11/01/24 0958 131/89     Girls Systolic BP Percentile --      Girls Diastolic BP Percentile --      Boys Systolic BP Percentile --      Boys Diastolic BP Percentile --      Pulse Rate 11/01/24 0958 (!) 102     Resp 11/01/24 0958 20     Temp 11/01/24 0958 98.5 F (36.9 C)     Temp Source 11/01/24 0958 Oral     SpO2 11/01/24 0958 97 %     Weight --      Height --      Head Circumference --      Peak Flow --      Pain Score 11/01/24 0955 3     Pain Loc --      Pain Education --      Exclude from Growth Chart --    No data found.  Updated Vital Signs BP 131/89 (BP Location: Right Arm)   Pulse (!) 102   Temp 98.5 F (36.9 C) (Oral)   Resp 20   SpO2 97%   Visual Acuity Right Eye Distance:   Left Eye Distance:   Bilateral Distance:    Right Eye  Near:   Left Eye Near:    Bilateral Near:     Physical Exam Vitals and nursing note reviewed.  Constitutional:      General: He is not in acute distress.    Appearance: Normal appearance. He is not ill-appearing or toxic-appearing.  HENT:     Head: Normocephalic and atraumatic.     Right Ear: Tympanic membrane and ear canal normal.     Left Ear: Tympanic membrane and ear canal normal.     Nose: Congestion present.     Mouth/Throat:     Mouth: Mucous membranes are moist.     Pharynx: No oropharyngeal exudate or posterior oropharyngeal erythema.  Eyes:     Pupils: Pupils are equal, round, and reactive to light.  Cardiovascular:     Rate and Rhythm: Regular rhythm. Tachycardia present.     Heart sounds: Normal heart sounds.     Comments: Mildly tachycardia heart rate 102 Pulmonary:     Effort: Pulmonary effort is normal.     Breath sounds: Wheezing present.     Comments: Slight expiratory wheeze bilateral lower bases Musculoskeletal:     Cervical back: Normal range of motion  and neck supple.  Lymphadenopathy:     Cervical: No cervical adenopathy.  Skin:    General: Skin is warm and dry.  Neurological:     General: No focal deficit present.     Mental Status: He is alert and oriented to person, place, and time.  Psychiatric:        Mood and Affect: Mood normal.        Behavior: Behavior normal.      UC Treatments / Results  Labs (all labs ordered are listed, but only abnormal results are displayed) Labs Reviewed  POCT INFLUENZA A/B - Normal  POC SOFIA SARS ANTIGEN FIA    EKG   Radiology No results found.  Procedures Procedures (including critical care time)  Medications Ordered in UC Medications  ipratropium-albuterol  (DUONEB) 0.5-2.5 (3) MG/3ML nebulizer solution 3 mL (3 mLs Nebulization Given 11/01/24 1031)    Initial Impression / Assessment and Plan / UC Course  I have reviewed the triage vital signs and the nursing notes.  Pertinent labs & imaging results that were available during my care of the patient were reviewed by me and considered in my medical decision making (see chart for details).     Reviewed exam and symptoms with patient.  Negative COVID and flu.  Wet read of x-ray without obvious consolidation, will contact for any positive results based on radiology overread once available.  Discussed viral illness/reactive airway.  Albuterol , prednisone  and Tessalon  as prescribed.  Advise rest fluids and PCP follow-up 2 to 3 days for recheck.  ER precautions reviewed. Final Clinical Impressions(s) / UC Diagnoses   Final diagnoses:  Acute cough  Wheezing  Viral upper respiratory illness     Discharge Instructions      You tested negative for COVID and flu.  You may take Tessalon  3 times a day as needed for your cough.  Use the albuterol  inhaler as needed for wheezing or shortness of breath.  Prednisone  daily for 5 days.  Lots of rest and fluids and follow-up with your PCP in 2 to 3 days for recheck.  Please go to the ER for any  worsening symptoms.  Hope you feel better soon!     ED Prescriptions     Medication Sig Dispense Auth. Provider   albuterol  (VENTOLIN  HFA) 108 (90 Base)  MCG/ACT inhaler Inhale 1-2 puffs into the lungs every 6 (six) hours as needed for wheezing or shortness of breath. 1 each Julious Langlois, Jodi R, NP   predniSONE  (DELTASONE ) 20 MG tablet Take 2 tablets (40 mg total) by mouth daily with breakfast for 5 days. 10 tablet Brayen Bunn, Jodi R, NP   benzonatate  (TESSALON ) 200 MG capsule Take 1 capsule (200 mg total) by mouth 3 (three) times daily as needed for cough. 20 capsule Kaelan Emami, Jodi R, NP      PDMP not reviewed this encounter.     [1]  Social History Tobacco Use   Smoking status: Every Day    Current packs/day: 0.50    Average packs/day: 0.5 packs/day for 11.0 years (5.5 ttl pk-yrs)    Types: Cigarettes    Start date: 2015   Smokeless tobacco: Never  Vaping Use   Vaping status: Never Used  Substance Use Topics   Alcohol use: Yes   Drug use: No     Loreda Myla SAUNDERS, NP 11/01/24 1157  "
